# Patient Record
Sex: Female | Born: 1935 | Race: White | Hispanic: No
Health system: Southern US, Community
[De-identification: ages and names within clinical notes are randomized; demographics above are authoritative.]

---

## 1999-03-26 ENCOUNTER — Other Ambulatory Visit: Admission: RE | Admit: 1999-03-26 | Discharge: 1999-03-26 | Payer: Self-pay | Admitting: Family Medicine

## 2003-12-04 ENCOUNTER — Inpatient Hospital Stay (HOSPITAL_COMMUNITY): Admission: EM | Admit: 2003-12-04 | Discharge: 2003-12-17 | Payer: Self-pay | Admitting: Emergency Medicine

## 2004-01-07 ENCOUNTER — Encounter
Admission: RE | Admit: 2004-01-07 | Discharge: 2004-01-07 | Payer: Self-pay | Admitting: Thoracic Surgery (Cardiothoracic Vascular Surgery)

## 2004-05-20 ENCOUNTER — Ambulatory Visit: Payer: Self-pay | Admitting: Physical Medicine & Rehabilitation

## 2004-05-20 ENCOUNTER — Encounter (INDEPENDENT_AMBULATORY_CARE_PROVIDER_SITE_OTHER): Payer: Self-pay | Admitting: *Deleted

## 2004-05-20 ENCOUNTER — Inpatient Hospital Stay (HOSPITAL_COMMUNITY): Admission: EM | Admit: 2004-05-20 | Discharge: 2004-05-24 | Payer: Self-pay | Admitting: Emergency Medicine

## 2004-09-05 ENCOUNTER — Encounter: Admission: RE | Admit: 2004-09-05 | Discharge: 2004-09-05 | Payer: Self-pay | Admitting: Family Medicine

## 2005-09-14 ENCOUNTER — Inpatient Hospital Stay (HOSPITAL_COMMUNITY): Admission: EM | Admit: 2005-09-14 | Discharge: 2005-09-19 | Payer: Self-pay | Admitting: *Deleted

## 2006-08-21 ENCOUNTER — Emergency Department (HOSPITAL_COMMUNITY): Admission: EM | Admit: 2006-08-21 | Discharge: 2006-08-21 | Payer: Self-pay | Admitting: Emergency Medicine

## 2008-07-19 IMAGING — CT CT MAXILLOFACIAL W/O CM
4 of 5 series · 16 of 30 positions shown, 18 images · non-contrast
Comparison: 05/21/2004

CLINICAL DATA: Fall with forehead hematoma.

HEAD CT WITHOUT CONTRAST
TECHNIQUE: 5mm collimated images were obtained from the base of the skull
through the vertex according to standard protocol without contrast.
TECHNIQUE: Axial and coronal plane CT imaging of the maxillofacial structures
was performed including the facial bones, paranasal sinuses, and orbits.  No
intravenous contrast was administered.

[Series 3: recon 2: brain · axial · 0.47mm/px · z∈[+163,+271]mm · 4 of 64 slices shown]
[im 13/64  bone]
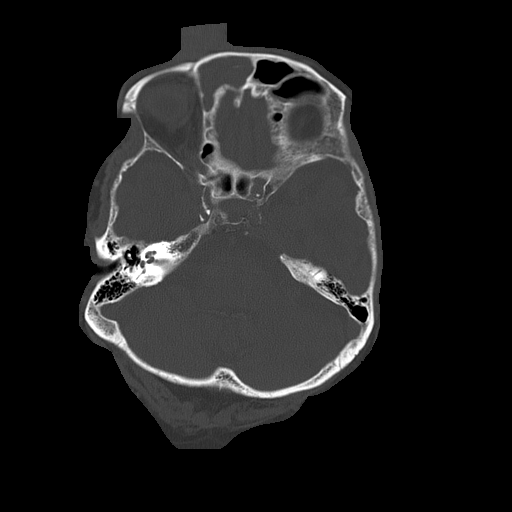
[im 26/64  bone]
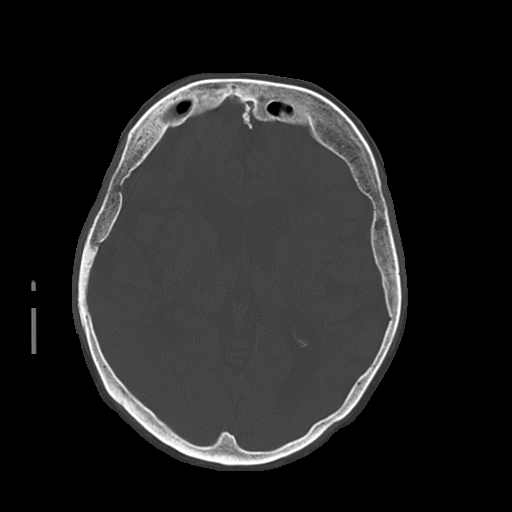
[im 38/64  bone]
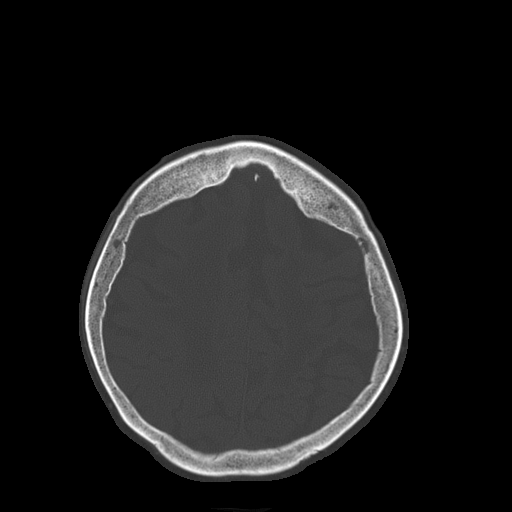
[im 51/64  bone]
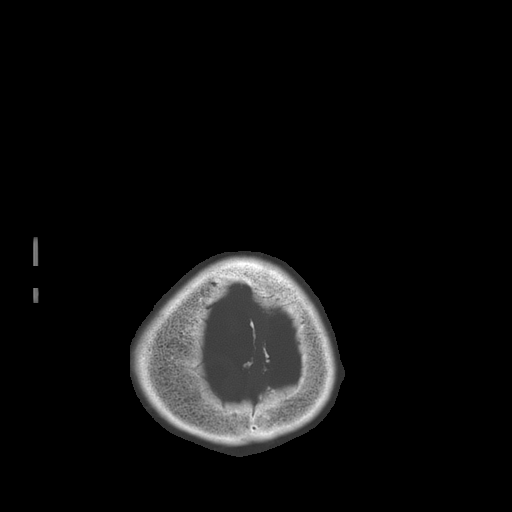

[Series 5: recon 2: supine facial bones · axial · 0.35mm/px · z∈[+37,+152]mm · 5 of 70 slices shown, 7 images]
[im 12/70  brain]
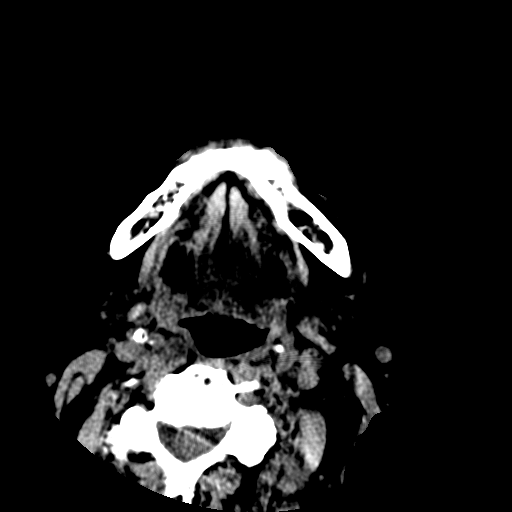
[im 12/70  bone]
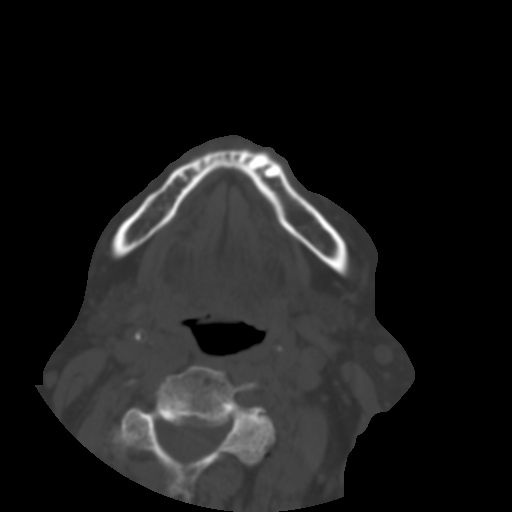
[im 24/70  bone]
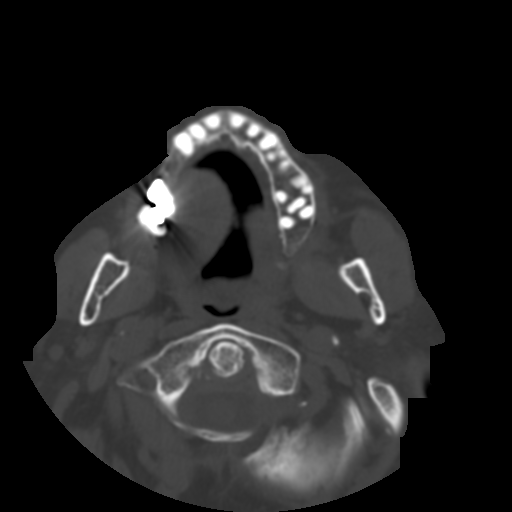
[im 35/70  bone]
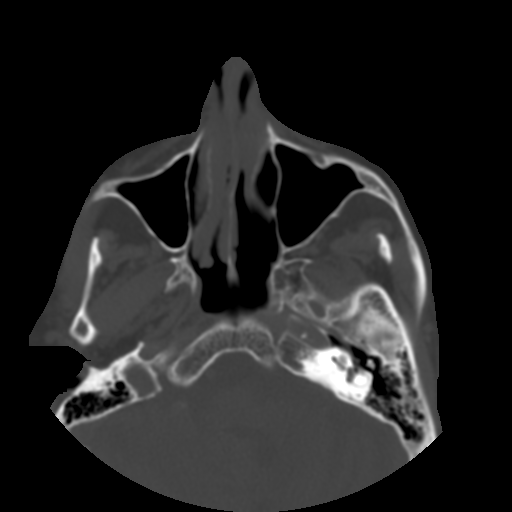
[im 47/70  bone]
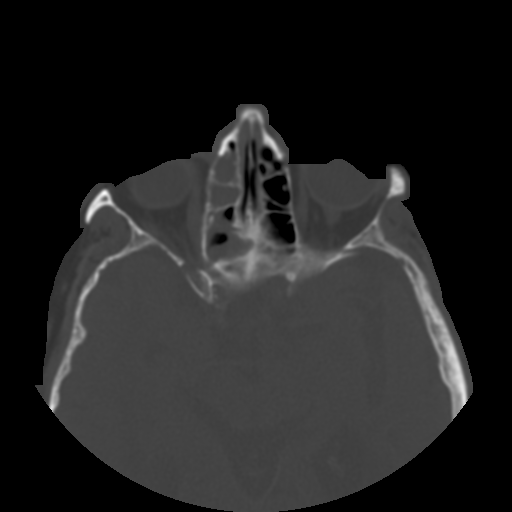
[im 58/70  brain]
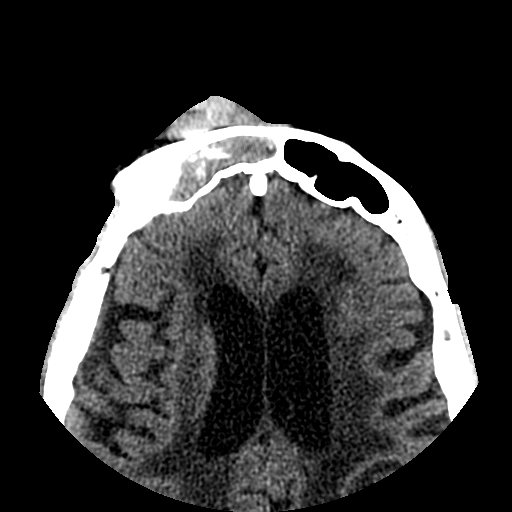
[im 58/70  bone]
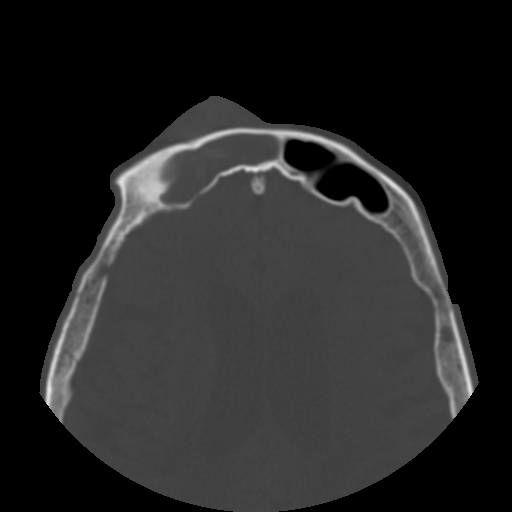

[Series 600: reformatted · sagittal · 0.35mm/px · 5 of 77 slices shown (1 of 2)]
[im 13/77  bone]
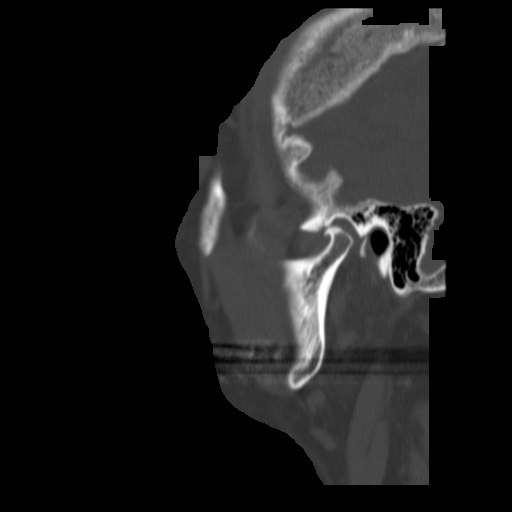
[im 26/77  bone]
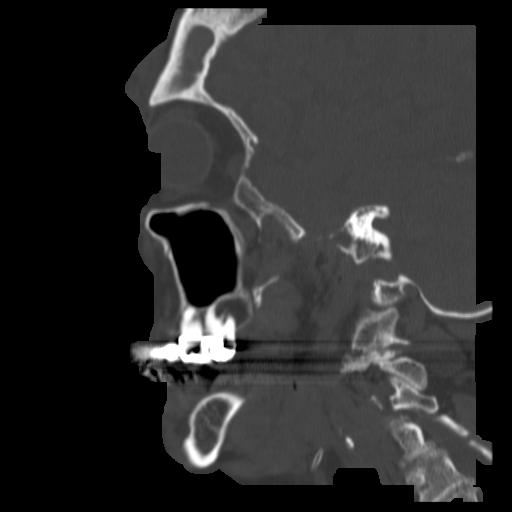
[im 39/77  bone]
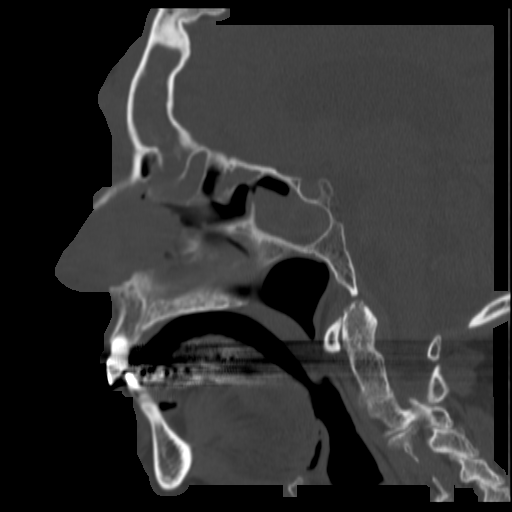
[im 51/77  bone]
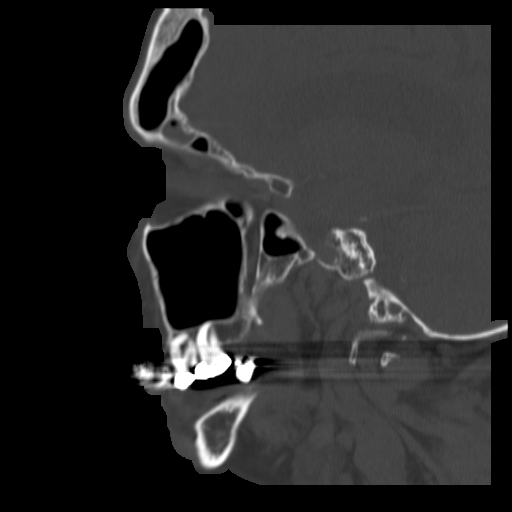
[im 64/77  bone]
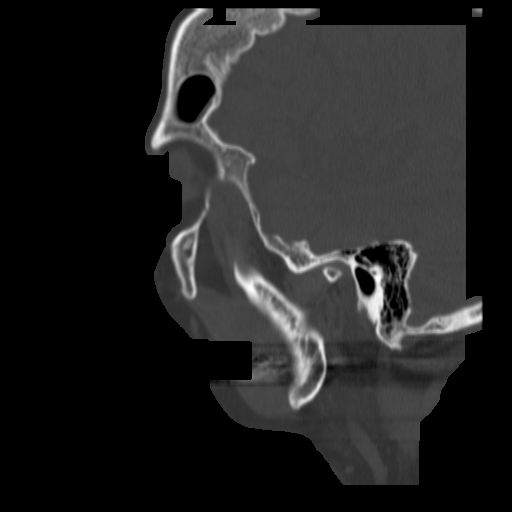

[Series 601: reformatted · coronal · 0.35mm/px · 2 of 72 slices shown (2 of 2)]
[im 12/72  bone]
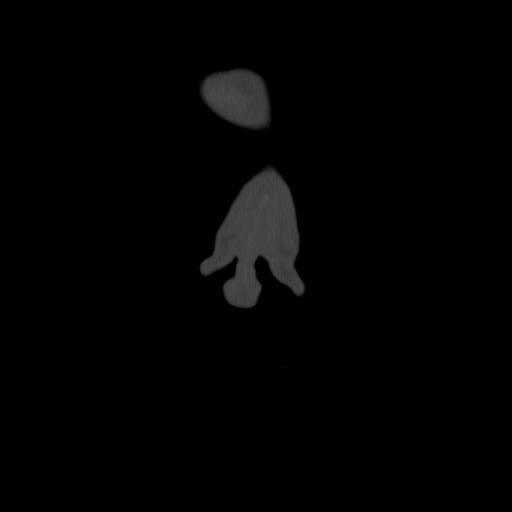
[im 24/72  bone]
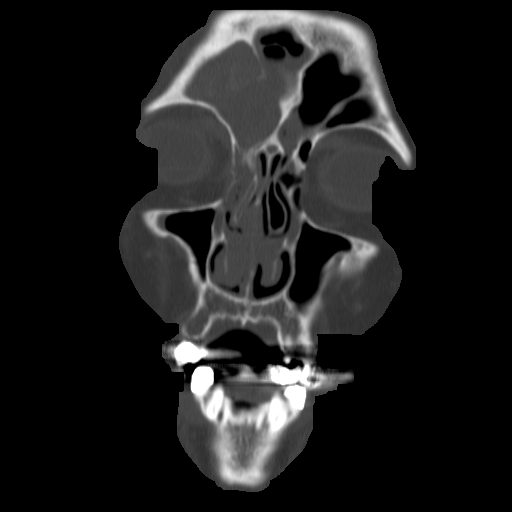

[16 of 30 positions shown; findings below may reference images not displayed]

FINDINGS: Prominent periventricular white matter and corona radiata hypodensity
is present and is consistent with chronic ischemic microvascular white matter
disease. Remote left temporal lobe encephalomalacia noted. No acute CVA, mass
lesion, or intracranial hemorrhage is identified.

There is chronic opacification of the right ethmoid air cells, right sphenoid
sinus, and right frontal sinus and, compatible with chronic sinusitis. Prominent
right forehead scalp hematoma noted. This tracks down along the bridge of the
nose and into the periorbital region on the right.

IMPRESSION

1. Right forehead hematoma tracking down along the bridge of the nose and into
the right periorbital region.
2. Chronic ischemic microvascular white matter disease.
3. Old left temporal encephalomalacia. 

---

MAXILLOFACIAL CT WITHOUT CONTRAST
FINDINGS: Upper cervical spondylosis is present. There is subtotal
opacification the right frontal sinus with internal lacelike calcifications,
stable from the prior exam from 2442 and compatible with chronic sinusitis. In
the right frontal scalp, there is a prominent scalp hematoma tracking down along
the bridge of the nose. There appears to be a nondisplaced fracture of the left
nasal bone. The anterior nasal spine appears intact. The pterygoid plates and
zygomatic arches appear intact.

There is stable chronic opacification of multiple right-sided ethmoid air cells
and stable subtotal opacification of the right sphenoid sinus. No discrete
intraorbital abnormality is identified. No gross injury of either globe is
noted.

IMPRESSION

1. Nondisplaced left nasal fracture.
2. Chronic right-sided sinus opacification.
3. Prominent right forehead scalp hematoma.

## 2009-02-01 ENCOUNTER — Emergency Department (HOSPITAL_COMMUNITY): Admission: EM | Admit: 2009-02-01 | Discharge: 2009-02-02 | Payer: Self-pay | Admitting: Emergency Medicine

## 2010-04-11 ENCOUNTER — Emergency Department (HOSPITAL_COMMUNITY)
Admission: EM | Admit: 2010-04-11 | Discharge: 2010-04-11 | Payer: Self-pay | Source: Home / Self Care | Admitting: Emergency Medicine

## 2010-06-30 LAB — BASIC METABOLIC PANEL
Chloride: 109 mEq/L (ref 96–112)
Creatinine, Ser: 1.18 mg/dL (ref 0.4–1.2)
GFR calc Af Amer: 53 mL/min — ABNORMAL LOW (ref >60)
GFR calc non Af Amer: 44 mL/min — ABNORMAL LOW (ref >60)
Potassium: 4.8 mEq/L (ref 3.5–5.1)

## 2010-06-30 LAB — DIFFERENTIAL
Basophils Absolute: 0.1 10*3/uL (ref 0.0–0.1)
Eosinophils Relative: 9 % — ABNORMAL HIGH (ref 0–5)
Lymphocytes Relative: 16 % (ref 12–46)
Lymphs Abs: 2.1 10*3/uL (ref 0.7–4.0)
Neutro Abs: 8.6 10*3/uL — ABNORMAL HIGH (ref 1.7–7.7)

## 2010-06-30 LAB — URINALYSIS, ROUTINE W REFLEX MICROSCOPIC
Glucose, UA: NEGATIVE mg/dL
Nitrite: NEGATIVE
pH: 6 (ref 5.0–8.0)

## 2010-06-30 LAB — CBC
HCT: 42.6 % (ref 36.0–46.0)
MCV: 94 fL (ref 78.0–100.0)
Platelets: 223 10*3/uL (ref 150–400)
RBC: 4.53 MIL/uL (ref 3.87–5.11)
WBC: 13.2 10*3/uL — ABNORMAL HIGH (ref 4.0–10.5)

## 2010-07-24 LAB — URINE CULTURE

## 2010-07-24 LAB — CBC
HCT: 37.2 % (ref 36.0–46.0)
Hemoglobin: 12.8 g/dL (ref 12.0–15.0)
MCHC: 34.3 g/dL (ref 30.0–36.0)
MCV: 92.1 fL (ref 78.0–100.0)
RBC: 4.04 MIL/uL (ref 3.87–5.11)
WBC: 13.4 10*3/uL — ABNORMAL HIGH (ref 4.0–10.5)

## 2010-07-24 LAB — URINALYSIS, ROUTINE W REFLEX MICROSCOPIC
Glucose, UA: NEGATIVE mg/dL
Ketones, ur: NEGATIVE mg/dL
Protein, ur: NEGATIVE mg/dL
Urobilinogen, UA: 0.2 mg/dL (ref 0.0–1.0)

## 2010-07-24 LAB — BASIC METABOLIC PANEL
CO2: 21 mEq/L (ref 19–32)
Chloride: 108 mEq/L (ref 96–112)
Creatinine, Ser: 0.96 mg/dL (ref 0.4–1.2)
GFR calc Af Amer: >60 mL/min (ref >60)
Potassium: 4.5 mEq/L (ref 3.5–5.1)
Sodium: 137 mEq/L (ref 135–145)

## 2010-07-24 LAB — DIFFERENTIAL
Basophils Relative: 1 % (ref 0–1)
Eosinophils Absolute: 1.1 10*3/uL — ABNORMAL HIGH (ref 0.0–0.7)
Eosinophils Relative: 8 % — ABNORMAL HIGH (ref 0–5)
Lymphs Abs: 1.7 10*3/uL (ref 0.7–4.0)
Monocytes Absolute: 0.7 10*3/uL (ref 0.1–1.0)
Monocytes Relative: 5 % (ref 3–12)

## 2010-07-24 LAB — POCT CARDIAC MARKERS
CKMB, poc: 1.2 ng/mL (ref 1.0–8.0)
Myoglobin, poc: 80.3 ng/mL (ref 12–200)
Myoglobin, poc: 93.9 ng/mL (ref 12–200)

## 2010-07-24 LAB — URINE MICROSCOPIC-ADD ON

## 2010-09-05 NOTE — Discharge Summary (Signed)
[name], Holly Rios                  ACCOUNT NO.:  [id number]   MEDICAL RECORD NO.:  [id number]          PATIENT TYPE:  INP   LOCATION:  1412                         FACILITY:  [facility]   PHYSICIAN:  Sherin Quarry, MD      DATE OF BIRTH:  02-Jan-1926   DATE OF ADMISSION:  09/14/2005  DATE OF DISCHARGE:  09/19/2005                                 DISCHARGE SUMMARY   Holly Rios is a 77 year old lady with a past history of coronary artery  disease, status post CABG, glaucoma, and dementia, who initially presented  to [facility] on Sep 14, 2005 with increased confusion and  weakness.  In the emergency room, she was noted to have evidence of pyuria,  elevated white count, and dehydration.  In addition, she was noted to have  an elevated blood sugar, with no previous history of diabetes.  When she was  initially seen by Dr. Rito Ehrlich in the emergency room, he really was not able  to get any history because of her mental confusion and lethargy.   PHYSICAL EXAMINATION:  VITAL SIGNS:  Temperature 98.7, heart rate 82, blood  pressure 103/31, respirations 24, O2 saturation 98%.  HEENT:  Within normal limits.  CHEST:  Clear.  CARDIOVASCULAR:  Showed normal S1 and S2, without rubs, murmurs, or gallops.  ABDOMEN:  Benign.  NEUROLOGIC:  The patient was disoriented.  She moved all extremities.  There  were no focal findings.  EXTREMITIES:  Showed no evidence of cyanosis.  There was trace pitting  edema.   RELEVANT LABORATORY STUDIES OBTAINED:  Included a urinalysis which showed  large blood and was positive for nitrite and leukocytes.  The urine  microscopic examination showed too numerous to count white cells, with  bacteria present.  CBC:  White count 13,300, hemoglobin 12.5.  On the CMET,  the blood sugar was 220, potassium 4, creatinine 1.2, BUN 28.  Liver  functions were normal.  Subsequently, blood cultures were obtained which  grew out an Escherichia coli species which was resistant to  ampicillin and  Augmentin.  It was sensitive to all other antibiotics tested as well as  trimethoprim sulfa.  Urine culture is also growing Escherichia coli, but the  sensitivities have not been done as of this time.   On admission, Holly Rios was placed on a sliding scale insulin regimen.  She  was given an IV of normal saline at 85 cc/hour.  Cipro 400 mg IV q.12 h. was  initiated.  Her blood sugar initially was in the range of 180-250, and  therefore 10 units of Lantus insulin daily was added to her regimen.  On Sep 15, 2005, Cipro was discontinued because the patient was having persistent  nausea and vomiting, and there was concern that the Cipro might have induced  this.  Therefore, she was switched to Zosyn 3.375 g IV q.6 h.  After  discussion with the patient and her family, she was made a Do Not  Resuscitate status.  As a screening study, a renal ultrasound was done which  showed normal-size kidneys,  with very mild dilatation of the right renal  pelvis and no evidence of obstruction or abscess.  A chest x-ray showed very  mild vascular congestion.  On Sep 16, 2005, the patient was noted to have a  slight degree of wheezing on auscultation of the chest.  I suspected that  she was somewhat volume overloaded and at that time discontinued her  intravenous fluids and gave her 2 mg of IV Lasix.  After this was done, the  wheezing resolved.  On Sep 17, 2005, the patient's Foley catheter was  discontinued.  Lantus insulin was also discontinued, because the patient's  blood sugars had improved and were in the range of 100-130.  Amaryl had been  started at 2 mg daily.  By September 19, 2005, the patient appeared to be a  candidate for discharge.   DISCHARGE DIAGNOSES:  1.  Urinary tract infection, with gram negative sepsis.  2.  Increased mental confusion secondary to #1.  3.  Dementia.  4.  Diabetes, apparently new onset.  5.  Glaucoma.  6.  Coronary artery disease, status post coronary  artery bypass graft.  7.  Depression.  8.  Mild transient renal dysfunction.   DISCHARGE MEDICATIONS:  The patient will continue all of her usual  medications which consisted of aspirin 81 mg daily, multivitamin 1 daily, Os-  Cal D 1 daily, metoprolol 50 mg b.i.d., Paxil 20 mg daily, Cosopt 0.05% 1  drop to each eye b.i.d., Plavix 75 mg daily, Requip 1 mg daily, Lipitor 10  mg daily, Xanax 0.25 mg at bedtime daily, Lumigan 1 drop each eye at  bedtime, Darvocet p.r.n. for headache.  In addition, she will be advised to  take Amaryl 2 mg daily, and Ceftin 500 mg b.i.d. for 8 additional days.  As  mentioned previously, the reason the patient was stopped from her quinolone  is because of the concern that this medication might have caused her nausea  and vomiting.   She was advised to follow up with Dr. Elsworth Soho in about 7 days to  recheck urine status and blood sugar.  During the course of Mrs. Mathews's  hospitalization, significant improvement in her mental status was noted.  She will continue to be cared for at home by her family, who seemed to be  doing an excellent job of meeting her day-to-day needs.   CONDITION AT THE TIME OF DISCHARGE:  Good.           ______________________________  Sherin Quarry, MD     SY/MEDQ  D:  09/18/2005  T:  09/18/2005  Job:  [id number]   cc:   L. Lupe Carney, M.D.  Fax: [phone]

## 2010-09-05 NOTE — Discharge Summary (Signed)
Holly Rios, Holly Rios                  ACCOUNT NO.:  [id number]   MEDICAL RECORD NO.:  [id number]          PATIENT TYPE:  INP   LOCATION:  4702                         FACILITY:  [facility]   PHYSICIAN:  Theone Stanley, MD   DATE OF BIRTH:  1926-01-22   DATE OF ADMISSION:  05/19/2004  DATE OF DISCHARGE:  05/24/2004                                 DISCHARGE SUMMARY   ADMITTING DIAGNOSES:  1.  Confusion.  2.  Multiple sclerosis.  3.  Depression.  4.  Coronary artery bypass graft in 2005.   DISCHARGE DIAGNOSES:  1.  Acute cerebrovascular accident of the left temporal and temporal      parietal regions.  2.  Confusion.  3.  Multiple sclerosis.  4.  Depression.  5.  Coronary artery bypass graft in 2005.   CONSULTATIONS:  Neurology Dr. Anne Hahn, speech therapy, physical therapy, and  occupational therapy.   PROCEDURES/DIAGNOSTIC TESTS:  The patient had a CT scan of the brain on  January 30; overall impression is chronic atrophy and advanced small vessel  ischemic changes in hemispheric white matter, questionable acute-to-subacute  starting in left temporal through temporal parietal region.  MRI of the  brain was performed on February 1.  Impression:  Hypertensive signal on the  left temporal and left temporal parietal region most consistent with acute  ischemic infarct.  Extensive white matter changes with white matter tissue  bihemispherically, hyperintensive signal with corpus callosum patient's  history of MS, diffuse atrophy for age, pansinusitis, changes prominent in  the right frontal and right sphenoid ethmoid region. MI was also performed  at that time which showed a high suspicion of hyperdynamic significant  stenosis of left vertebral basilar junction with some absent fluorescein on  the right vertebral artery may reflect severe flow secondary to cirrhosis or  inclusion.  Probably high grade stenosis focal in nature of the left middle  cerebral artery proximally, scattered  atherosclerotic changes involving the  middle cerebral artery trifurcation regions and also in the left posterior  cerebral artery.  MRI of the neck showed focal high-grade stenosis of the  left vertebral basilar junction with probably 50% stenosis at the origin of  the left dominant vertebral artery, probable high-grade stenosis involving  the right vertebral artery proximally, approximately 60% stenosis of the  right internal carotid artery at the bulb by the criteria.  The patient also  had a swallow evaluation by speech therapy and there recommendations were a  regular diet with thin liquids.  A CT of the chest was performed, along on  February 1 which showed an 8 mm nodular density in the right lower lobe,  superior segment, and no other masses were seen.  Follow up CT suggested at  3 months interval for 1 year to assure stability.   The patient had an echocardiogram on May 20, 2004. Overall impression  was left ventricular systolic function was normal.  Left ventricular  ejection fraction was estimated to be 55-65%.  Study was inadequate for  evaluation of left ventricular wall motion abnormality.  The left  ventricular  wall thickness was at the upper limits of normal.  I felt that  this was mild holosystolic murmur, valve prolapse along the anterior leaflet  with mild mitral annular calcification. There was mild valvular  regurgitation.   A carotid Doppler was also performed with impression of right, 46% of IA/CA  stenosis, lower end of scale, left no evidence of significant ICA stenosis,  bilateral vertebral artery flow was antegrade.   HOSPITAL COURSE:  Ms. Jabs is a 77 year old Caucasian female with multiple  sclerosis which has been stable at this point in time.  When the family  noticed, last p.m. prior to admission she seemed a little bit confused.  She  was found in the laundry room trying to turn on the television.  She was  easily redirected and showed no other  neurological deficit at that time.  At  10 a.m. prior to admission her daughter had called and she seemed to be  confused and she had altered speech.  At that point in time the patient was  brought to the ER.  After evaluation it was noted that the patient had an  acute CVA.   Issue #1, acute CVA, at that point in time the hospitalists were contacted  for admission and neurology was consulted.  Evaluation of the patient.  The  patient had a workup for her acute stroke including MRA, MRI, carotid  Dopplers and echocardiogram.  Please see results above.  It was decided to  place the patient on Plavix because she was on aspirin.  In addition, she  was placed on Zocor.  Rehab was consulted and there was a question of  whether she would go to rehab. However, the family did not want her to go.  They wanted her to go home with home PT and OT.  This was agreed upon and  she was discharged to home with home PT and OT in stable condition.   In regards to her other medical issues.  The patient has a history of MS,  this has been stable.  She will follow up with her neurologist in regards to  that.  She was continued on all of her medications while here in the  hospital.   The only other issue is this small nodule on the CT scan of the chest.  This  will need to be followed up when she has repeat CT scans suggested by the  radiologist; and we will let her primary care physician continue with these  follow up CTs.   She did have a urinary tract infection; it was discovered in the hospital.  She was started on Cipro and her dysuria and malodorous urine resolved on  the Cipro.   The patient left the hospital in stable on 05/24/2004.   DISCHARGE MEDICATIONS:  1.  Plavix 75 mg 1 p.o. daily.  2.  Zocor 20 mg 1 p.o. q.h.s.  3.  Lexapro 10 mg 1 p.o. daily.  4.  Lopressor 25 mg 1 p.o. b.i.d.  5.  Multivitamin 1 p.o. daily.  6.  Several eye drops including Lumigan, Trusopt and Timoptic eye drops at      home dosage.  7.  Pred forte eye drops at home dosage.  8.  Colace 100 mg 1 p.o. b.i.d.  9.  Ciprofloxacin 500 mg 1 p.o. b.i.d. for an additional 4 days.  10. Aspirin 81 mg 1 p.o. daily.  11. Xanax 0.25 mg 1 p.o. q.h.s.  12. ??  1 p.o. q.h.s.  13. Iron sulfate 325 1 p.o. b.i.d.  14. Darvocet-N 100 at home dosage.   DISCHARGE INSTRUCTIONS:  The patient was instructed to continue with a  cardiac diet.  She had home health care, PT, OT, and speech therapy ordered  for home.  The patient was to follow up with Dr. Pearlean Brownie the neurologist in 2  months and Dr. Clovis Riley in 2-3 weeks.      AEJ/MEDQ  D:  08/05/2004  T:  08/05/2004  Job:  [id number]   cc:   Pramod P. Pearlean Brownie, MD  Fax: [phone]

## 2010-09-05 NOTE — H&P (Signed)
Holly Rios, Holly Rios                              ACCOUNT NO.:  [id number]   MEDICAL RECORD NO.:  [id number]                   PATIENT TYPE:  EMS   LOCATION:  MAJO                                 FACILITY:  [facility]   PHYSICIAN:  Kela Millin, M.D.             DATE OF BIRTH:  1925-09-26   DATE OF ADMISSION:  12/04/2003  DATE OF DISCHARGE:                                HISTORY & PHYSICAL   PRIMARY CARE PHYSICIAN:  Dr. Lupe Carney.   CHIEF COMPLAINT:  Chest pain.   HISTORY OF PRESENT ILLNESS:  The patient is a 77 year old white female with  past medical history significant for multiple sclerosis and hiatal hernia  who presents with complaints of chest pain.  The patient says that she first  started having chest pain about 2 months ago but in the past 6 weeks the  chest pain has become more frequent and today she had 2 episodes.  The chest  pain is described as mid-sternal in location and radiating down her right  upper extremity, 8/10 in intensity, associated with nausea but no vomiting.  The patient denies shortness of breath, diaphoresis.  The chest pain is  worsened by exertion.  Holly Rios was seen by her primary care physician today  and her EKG noted to be abnormal and the patient was sent to the ER for  further evaluation and management.  The patient denies cough, fevers,  dysuria, abdominal pain, melena, hematochezia, hematemesis, and melena.   PAST MEDICAL HISTORY:  As stated above.   CURRENT MEDICATIONS:  1. Darvocet 1 p.o. t.i.d. p.r.n.  2. Xanax 0.25 mg p.o. b.i.d. p.r.n.  3. Celebrex 200 mg daily -- the patient stopped taking Celebrex about 2     months ago.  4. Calcium q.h.s.   SOCIAL HISTORY:  Quit tobacco about 30 years ago, denies alcohol, also  denies illicit drug use.   FAMILY HISTORY:  Mom and dad had hypertension and heart disease.   REVIEW OF SYSTEMS:  As per HPI.  Other comprehensive review of systems  negative.   PHYSICAL EXAMINATION:  GENERAL:  The  patient is an elderly white female,  well-nourished, well-developed, in no acute distress.  VITAL SIGNS:  Blood pressure 179/89, initially 147/69.  Her pulse was 99 and  temperature 99.4.  O2 saturation 99%.  HEENT:  PERRL, EOMi, moist mucous membranes, sclerae are anicteric, no oral  exudate.  NECK:  Supple, no adenopathy, no thyromegaly, no JVD and no carotid bruits.  LUNGS:  Clear to auscultation bilaterally.  No crackles or wheezes.  CARDIOVASCULAR:  Normal S1, S2, regular rate and rhythm, no S3.  ABDOMEN:  Soft, nontender, nondistended, no organomegaly.  Bowel sounds  present, no masses palpable.  EXTREMITIES:  No clubbing, cyanosis or edema.  NEURO:  Alert and oriented x3.  Cranial nerves II-XII are grossly intact.  Nonfocal exam.   ADMISSION LABORATORIES:  Chest  x-ray:  No acute disease.  Sodium is 142, potassium 4.7, chloride is 112, BUN 24, glucose 82, pH is  7.39, PCO2 of 40.9.  Hematocrit 38, hemoglobin is 12.9.  Her CK-MB is 1 and  the Troponin is less than 0.05.  The myoglobin is 62.6.  EKG:  There is ST depression noted in 2, 3 and AVF and also in V4 and V5.   ASSESSMENT AND PLAN:  1. Chest pain, ?unstable angina.  The patient with exertional chest pain and     ST changes on EKG, also increased frequency of chest pain over the past 6     weeks.  We will do serial cardiac enzymes and EKG.  We will start     aspirin, nitroglycerine and Lovenox 1 mg, subcutaneous q.12 hours.  We     will also consult cardiology for further evaluation/risk stratification.     The patient discussed with [facility].  2. History of multiple sclerosis - stable.  3. History of hiatal hernia - Protonix, follow.                                                Kela Millin, M.D.    ACV/MEDQ  D:  12/04/2003  T:  12/04/2003  Job:  [id number]   cc:   L. Lupe Carney, M.D.  [address] [place]  [place] [address]  Fax: [phone]

## 2010-09-05 NOTE — Op Note (Signed)
NAMEMICHA, Holly Rios                              ACCOUNT NO.:  [id number]   MEDICAL RECORD NO.:  [id number]                   PATIENT TYPE:  INP   LOCATION:  2310                                 FACILITY:  [facility]   PHYSICIAN:  Salvatore Decent. Dorris Fetch, M.D.         DATE OF BIRTH:  May 13, 1925   DATE OF PROCEDURE:  12/10/2003  DATE OF DISCHARGE:                                 OPERATIVE REPORT   PREOPERATIVE DIAGNOSIS:  Severe three-vessel coronary disease with unstable  angina.   POSTOPERATIVE DIAGNOSIS:  Severe three-vessel coronary disease with unstable  angina.   PROCEDURES:  1. Median sternotomy.  2. Extracorporeal circulation.  3. Coronary artery bypass grafting x3 (left internal mammary artery to the     left anterior descending coronary artery, saphenous vein graft to first     obtuse marginal, saphenous vein graft to distal right coronary).  4. Endoscopic vein harvest from both thighs.   SURGEON:  Salvatore Decent. Dorris Fetch, M.D.   ASSISTANT:  Toribio Harbour, N.P.   ANESTHESIA:  General.   FINDINGS:  Endoscopic vein harvest from right thigh vein was unusable due to  small caliber.  Left thigh vein good quality.  Good-quality targets.  Mammary artery with good quality.  Normal left ventricular size.   CLINICAL NOTE:  Holly Rios is a 77 year old woman with multiple sclerosis, who  presented with unstable angina.  At cardiac catheterization she was found to  have severe three-vessel coronary disease and preserved left ventricular  function.  The patient was referred for coronary artery bypass grafting.  Consideration was given to high-risk angioplasty, but it was felt that a  good result with LAD angioplasty was unlikely; therefore, the patient was  advised to undergo coronary artery bypass grafting for survival benefit and  relief of symptoms.  The indication, risks, benefits, and alternatives were  discussed in detail with the patient and her family.  She understood and  accepted the risks and agreed to proceed.   OPERATIVE NOTE:  Holly Rios was brought to the preop holding area on December 10, 2003.  There lines were placed by the anesthesia service to monitor  arterial, central venous, and pulmonary arterial pressure.  Intravenous  antibiotics were administered.  The patient was taken to the operating room  and anesthetized and intubated.  A Foley catheter was placed.  The chest,  abdomen, and legs were prepped and draped in the usual fashion.   An incision was made in the medial aspect of the right leg and greater  saphenous vein was harvested from the right thigh.  At the level of the knee  this appeared to be of adequate caliber; however, after removing the vein it  was found to be of very small caliber as it was a bifurcated system.  This  vessel was not suitable for use with bypass grafting.  Therefore, an  additional incision was made in the medial  aspect of the left leg at the  level of the knee and greater saphenous vein was harvested from the left  leg.  This vessel was of good quality.  A median sternotomy was performed.  The left internal mammary artery was harvested using standard technique.  The patient was fully heparinized prior to dividing the distal end of the  mammary artery.  There was good flow through the cut end of the vessel.  The  mammary was of adequate size and caliber.   The pericardium was opened and the ascending aorta was inspected and  palpated.  There was palpable atherosclerotic plaque just proximal to the  takeoff of the innominate artery.  The cannulation therefore was moved  approximately 2 cm more proximal to a site that did not have atherosclerotic  plaque.  The aorta was cannulated via concentric 2-0 Ethibond pledgeted  pursestring sutures.  A dual-stage venous cannula was placed via pursestring  suture in the right atrial appendage.  After confirming adequate  anticoagulation with ACT measurement, cardiopulmonary  bypass was instituted  and the patient was cooled to 32 degrees Celsius.  The coronary arteries  were inspected and anastomotic sites were chosen.  The conduits were  inspected and cut to length.  A foam pad was placed in the pericardium to  protect the left phrenic nerve.  A temperature probe was placed in the  myocardial septum and a cardioplegia cannula was placed in the ascending  aorta.   The aorta was crossclamped.  The left ventricle was emptied via the aortic  root vent.  Cardiac arrest then was achieved with a combination of cold  antegrade blood cardioplegia and topical iced saline.  After achieving a  complete diastolic arrest and a myocardial septal temperature of 10 degrees  Celsius, the following distal anastomoses were performed.   First a reversed saphenous vein graft was placed end-to-side to the right  coronary just prior to the takeoff of the posterior descending.  This was a  1.5 mm good-quality target.  The vein graft was of good quality and the  anastomosis was performed with a running 7-0 Prolene suture.  At completion  of the anastomosis a probe passed proximally and distally.  There was good  flow through the graft, and cardioplegia was administered.  There was good  hemostasis.   Next a reversed saphenous vein graft was placed end-to-side to the first  obtuse marginal branch of the left circumflex coronary artery.  This was a  large vessel that was a dominant lateral wall vessel.  It was a 1.5 mm  vessel at the site of the anastomosis.  It then trifurcated and there was  some disease at the trifurcation.  A 1.5 mm probe passed into the largest  branch.  Only a 1 mm probe would pass into a smaller branch.  The vessel was  not graftable because of small size beyond this trifurcation.  A reversed  saphenous vein graft was placed end-to-side to the obtuse marginal 1 with a running 7-0 Prolene suture.  Again the vein was of good quality.  The  anastomosis was  probed proximally and distally and a probe passed easily.  Cardioplegia then was administered.  There was good hemostasis.   Next the left internal mammary artery was brought through a window in the  pericardium.  The distal end was spatulated, then was anastomosed end-to-  side to the mid-LAD.  The LAD was a 1.5 mm good-quality target.  The mammary  was a 1.5 mm good-quality conduit.  The anastomosis was performed with a  running 8-0 Prolene suture.  At the completion of the mammary to LAD  anastomosis, the bulldog clamp was briefly removed and inspected for  hemostasis.  Immediate and rapid septal rewarming was noted.  The bulldog  clamp was briefly removed to inspect for hemostasis.  Immediate and rapid  septal rewarming was noted.  The bulldog clamp was replaced.  The mammary  pedicle was tacked to the epicardial surface of the heart with 6-0 Prolene  sutures.   Additional cardioplegia then was administered.  The vein grafts were cut to  length.  The cardioplegia cannula was removed from the ascending aorta and  the proximal vein graft anastomoses were performed to 4.0 mm punch  aortotomies while under crossclamp.  After completion of the final proximal  anastomosis, the patient was placed in Trendelenburg position.  Lidocaine  was administered.  The bulldog clamp was again removed from the mammary  artery.  The aortic root was de-aired and the aortic crossclamp was removed.  The total crossclamp time was 54 minutes.  The vein grafts were aspirated to  remove any entrapped air prior to releasing the bulldog clamps.  The patient  initially fibrillated but then spontaneously resumed sinus rhythm.  Total  crossclamp time was 54 minutes.   While the patient was being rewarmed, all proximal and distal anastomoses  were inspected for hemostasis.  Epicardial pacing wires were placed on the  right ventricle and right atrium.  When the patient had been rewarmed to a  core temperature of 37  degrees Celsius, she was weaned from cardiopulmonary  bypass without difficulty.  The total bypass time was 84 minutes.  The  initial cardiac index was low, but the patient responded to volume  resuscitation and initiation of a low-dose dopamine drip.  A test dose of  protamine was administered and was well-tolerated.  The atrial and aortic  cannulae were removed.  The remainder of the protamine was administered  without incident.  The chest was irrigated with 1 L of warm normal saline  containing 1 g of vancomycin.  Hemostasis was achieved.  A left pleural and  two mediastinal chest tubes were placed through separate subcostal  incisions.  The pericardium was reapproximated loosely with interrupted 3-0  silk sutures.  It came together without tension or kinking of the underlying  grafts.  The sternum was closed with interrupted heavy-gauge stainless steel wires.  The remainder of the incisions were closed in standard fashion,  subcuticular skin closures were used.  All sponge, needle, and instrument  counts were correct at the end of the procedure.  The patient taken from the  operating room to the surgical intensive care unit in critical but stable  condition.                                               Salvatore Decent Dorris Fetch, M.D.    SCH/MEDQ  D:  12/10/2003  T:  12/11/2003  Job:  [id number]   cc:   Aram Candela. Aleen Campi, M.D.  [address]  [place]  [place] [address]  Fax: [phone]

## 2010-09-05 NOTE — Op Note (Signed)
Holly Rios, Holly Rios                              ACCOUNT NO.:  [id number]   MEDICAL RECORD NO.:  [id number]                   PATIENT TYPE:  INP   LOCATION:  2310                                 FACILITY:  [facility]   PHYSICIAN:  Salvatore Decent. Dorris Fetch, M.D.         DATE OF BIRTH:  12-02-1925   DATE OF PROCEDURE:  12/11/2003  DATE OF DISCHARGE:                                 OPERATIVE REPORT   PREOPERATIVE DIAGNOSES:  Massive postoperative hemorrhage.   POSTOPERATIVE DIAGNOSES:  Massive postoperative hemorrhage.   OPERATION PERFORMED:  Re-exploration of the mediastinum for bleeding.   SURGEON:  Salvatore Decent. Dorris Fetch, M.D.   ASSISTANT:  Rowe Clack, P.A.-C.   ANESTHESIA:  General.   FINDINGS:  Arterial bleeding from a blowout of the vein graft to the right  coronary.  Unclear if this was a side branch or a weakened area of the vein.   INDICATIONS FOR PROCEDURE:  [name] is a 77 year old lady who on December 10, 2003 had undergone coronary artery bypass grafting times three.  After an  uncomplicated early postoperative course, approximately 4 o'clock a.m. on  December 11, 2003 the patient had sudden onset of massive bleeding from the  mediastinal chest tubes, approximately 700 mL came out in the first 15  minutes and then another 700 mL over the following 30 minutes.  The patient  had transient hypotension which responded appropriately to fluid  resuscitation. The patient was prepared and taken back to the operating room  as rapidly as possible.  The situation was discussed with the patient's  family and they consented to re-exploration.   DESCRIPTION OF PROCEDURE:  [name] was brought directly from the intensive  care unit to the operating room.  She was already intubated.  General  anesthesia was induced without significant hemodynamic effect.  The  patient's chest, abdomen and legs were prepped and draped in the usual  fashion.   The median sternotomy incision was reopened.   The wires were removed.  A  retractor was placed.  The chest was opened.  The anterior mediastinal tube  was elevated and there was marked arterial bleeding from an underlying vein  graft to the right coronary artery.  Soft bulldog clamps were placed  proximally and distally and the bleeding was repaired with two 7-0 Prolene  figure-of-eight sutures.  The defect in the vein graft was approximately 1  mm in diameter.  It was unclear if this was a side branch which had avulsed  or a  weakened area in the vein that had ruptured.   The remainder of the mediastinum then was carefully inspected.  Both vein  grafts stripped easily.  There was no other bleeding noted.  Both pleural  spaces were evacuated.  There was approximately 100 mL of old blood in each  pleural space.  The mediastinal tubes were suctioned.  The endotracheal  catheter removed any remaining  clot.  The chest was irrigated with 1 L of  warm normal saline containing 1 g of vancomycin.  Final inspection for  hemostasis was performed, no other bleeding sites were noted.  The chest  then was reclosed using heavy gauge interrupted  stainless steel wires.  The remainder of the incision was closed in standard  fashion.  Staples were used for the skin.  Because of the emergency nature  of the procedure, instrument counts were not performed prior to opening the  chest.  Therefore, an x-ray will be performed in the operating room.                                               Salvatore Decent Dorris Fetch, M.D.    SCH/MEDQ  D:  12/11/2003  T:  12/11/2003  Job:  [id number]

## 2010-09-05 NOTE — H&P (Signed)
Holly Rios, Holly Rios                  ACCOUNT NO.:  [id number]   MEDICAL RECORD NO.:  [id number]          PATIENT TYPE:  EMS   LOCATION:  ED                           FACILITY:  [facility]   PHYSICIAN:  Hollice Espy, M.D.DATE OF BIRTH:  05-25-1925   DATE OF ADMISSION:  09/14/2005  DATE OF DISCHARGE:                                HISTORY & PHYSICAL   PRIMARY CARE PHYSICIAN:  L. Lupe Carney, M.D.   CHIEF COMPLAINT:  Increased confusion.   HISTORY OF PRESENT ILLNESS:  The patient is a 77 year old white female with  a past medical history of CAD, status post CABG, and glaucoma, as well as  dementia, who presents to the emergency room with increased confusion and  weakness.  In the emergency room, she was found to have a large UTI,  elevated white count, and mild dehydration.  In addition, she was found to  have a sugar of 220, and no reported history of diabetes.  The patient lives  alone, but have family close by who are with her 24/7.  Currently, the  patient is not able to give me much of a history.  She has a hard time  focusing on any one question, but her family is able to interpret any  questions.  However, I am unable to get a review of systems, other than the  patient tells me that she is not in any pain.   PAST MEDICAL HISTORY:  1.  Dementia.  2.  CAD status post CABG.  3.  Hypertension.  4.  Glaucoma.   MEDICATIONS:  1.  Aspirin 81 mg one-half tablet p.o. daily.  2.  Multivitamin daily.  3.  Os-Cal D p.o. daily.  4.  Metoprolol 50 mg p.o. b.i.d.  5.  Paxil 20 p.o. daily.  6.  Cosopt 0.05% one drop each eye b.i.d.  7.  Plavix 75 p.o. daily.  8.  Requip 1 mg p.o. q.h.s.  9.  Lipitor 10 mg p.o. q.h.s.  10. Xanax 0.25 p.o. q.h.s.  11. Lumigan one drop each eye q.h.s.  12. Darvocet-N 100 1-1/2 tablets p.o. b.i.d.  (The patient is also on p.r.n. Darvocet and Xanax.)   SOCIAL HISTORY:  She does not have any history of tobacco, alcohol or drug  use.   ALLERGIES:  1.  DEMEROL.  2.  PHENERGAN.   FAMILY HISTORY:  Noncontributory.   PHYSICAL EXAMINATION:  VITAL SIGNS:  The patient's vitals on admission  revealed a temperature of 98.7, heart rate 82, blood pressure 103/31,  respirations 24, O2 saturation 98% on room air.  GENERAL:  The patient is alert and oriented x2.  HEENT:  Normocephalic and atraumatic.  Mucous membranes are slightly dry.  She has no carotid bruits.  HEART:  Regular rate and rhythm.  Soft S1 and S2.  LUNGS:  Clear to auscultation bilaterally.  ABDOMEN:  Soft, nontender, nondistended.  Positive bowel sounds.  EXTREMITIES:  No clubbing or cyanosis.  Trace pitting edema.   LABORATORY WORK:  Chest x-ray shows no acute abnormalities.  Urinalysis  shows nitrite positive, moderate leukocyte esterase.  Blood cultures drawn  and pending.  Micro UA shows too numerous to count white cells, 7-10 red  cells, a few bacteria and mucous.  Sodium 139, potassium 4, chloride 111,  bicarbonate 21, BUN 28, creatinine 1.2, glucose 220.  LFT's are notable for  an albumin of 3.2 and alkaline phosphatase of 139.  Urine culture is  pending.   ASSESSMENT AND PLAN:  1.  Urinary tract infection, possible sepsis.  The patient is borderline      hypotensive.  Plan to start the patient on IV fluids, IV Cipro renally      dosed, and continue to follow.  2.  Dementia.  The patient is not on any medications for dementia, but      hopefully with fluid hydration and IV antibiotics, her mental state      should go back to her baseline.  3.  Glaucoma.  Continue medications.  4.  Diabetes, hyperlipidemia.  No previous history of.  Will check      hemoglobin A1C and put the patient on a sliding scale.  Likely will need      to start oral medications.  5.  Coronary artery disease with history of coronary artery bypass graft.      Continue the patient's medications.  6.  Depression.  Continue Paxil.  7.  Renal insufficiency, secondary urinary tract infection.  IV  fluids.      Hollice Espy, M.D.  Electronically Signed     SKK/MEDQ  D:  09/14/2005  T:  09/14/2005  Job:  [id number]   cc:   L. Lupe Carney, M.D.  Fax: [phone]

## 2010-09-05 NOTE — Consult Note (Signed)
Holly Rios, [name]                  ACCOUNT NO.:  [id number]   MEDICAL RECORD NO.:  [id number]          PATIENT TYPE:  INP   LOCATION:  4702                         FACILITY:  [facility]   PHYSICIAN:  Marlan Palau, M.D.  DATE OF BIRTH:  [birth year]   DATE OF CONSULTATION:  05/20/2004  DATE OF DISCHARGE:                                   CONSULTATION   HISTORY OF PRESENT ILLNESS:  Holly Rios is a 77 year old right handed white  female born 05/19/1925, with a history of multiple sclerosis.  The patient  apparently had some undulating symptoms associated with speech disturbance,  confused speech that occurred on May 19, 2004.  This seemed to improve  but was worse again on the day of admission.  The patient was brought in for  further evaluation.  CT scan of the head done on admission revealed a  possible subacute or acute stroke evolving in the left temporal parietal  area but extensive small vessel disease is also noted in the deep white  matter.  No hemorrhage was seen.  Neurology was asked to see this patient  for further evaluation.  Carotid Doppler studies have been done showing 40-  60% stenosis of the right internal carotid artery, no stenosis of the left  internal carotid artery, bilateral vertebral artery flow was seen in an  antegrade fashion.   PAST MEDICAL HISTORY:  Significant for:  1.  History of multiple sclerosis.  2.  New onset aphasia syndrome, left temporal parietal stroke by CT.  3.  Coronary artery bypass procedure in the past.  4.  Cataract surgery on the right.  5.  Depression in the past.  6.  Hiatal hernia.   MEDICATIONS ON ADMISSION:  Lumigan ophthalmic solution 1 drop OU q.h.s.,  Plavix 75 mg daily, Trusopt 1 drop OU q.h.s., Lexapro 10 mg daily, Zymar  ophthalmic solution 1 drop q.i.d. OD, Metoprolol 25 mg q.12h., multi-  vitamins one daily, prednisone ophthalmic solution 1 drop OD q.2h., Zocor 20  mg daily, Timoptic 1 drop OU q.h.s., Xanax 0.25 mg if  needed, Darvocet if  needed.   ALLERGIES:  Phenergan and Demerol.   HABITS:  The patient does not smoke, quit 30 years ago, he does not drink  alcohol.   SOCIAL HISTORY:  The patient is widowed, lives with daughter, is retired.   FAMILY HISTORY:  Mother died in her 50s with coronary artery disease and a  stroke, father died with coronary artery disease.   REVIEW OF SYMPTOMS:  This is difficult to obtain, the patient denies  significant headache, numbness or weakness, does not report any visual field  changes, nausea and vomiting.   PHYSICAL EXAMINATION:  VITAL SIGNS:  Blood pressure 126/74, heart rate 96, respiratory rate 16,  temperature afebrile.  GENERAL:  This patient is a fairly well developed elderly white female who  is alert and cooperative at the time of examination.  HEENT:  Head is atraumatic.  Eyes:  Pupils are post surgical on the right,  round, reactive bilaterally, cataract seen on the left, discs are  flat  bilaterally.  NECK:  Supple, no carotid bruits noted.  RESPIRATORY EXAM:  Clear.  CARDIOVASCULAR:  Regular rate and rhythm, no obvious murmurs or rubs noted.  EXTREMITIES:  Without significant edema.  NEUROLOGICAL:  Cranial nerves as above.  Facial asymmetry present.  The  patient has full extraocular movements.  Appears to have a right homogenous  visual field deficit.  The patient has imperfect understanding of speech,  has some problems with repetition although not severe, has some problems  with naming.  Pin prick sensation is questionably altered.  The patient  claims it is more prominent on the right than the left on the face.  The  patient perseverates significantly over several questions.  Examination does  not show any obvious motor weakness of the arms or legs.  No drift is seen.  The patient claims to have symmetry to pin prick and vibration on the arms  and legs.  The patient is quite ataxic, cannot follow commands well enough  to perform  finger-to-nose-to-finger and heel-to-shin accurately, was not  ambulated.  Deep tendon reflexes are relatively symmetric, somewhat brisk,  bilateral Babinski's were seen.   LABORATORY DATA:  Notable for white count 15.7, hemoglobin 13.1, hematocrit  38.9, platelets 267.  Sodium 140, potassium 4.1, chloride 110, CO2 26,  glucose 103, BUN 20, creatinine 0.9, calcium 10.2.   CT of the head is as above.   EKG reveals normal sinus rhythm with first degree AV block, left atrial  enlargement, anterior septal infarction age undetermined, heart rate of 82.   IMPRESSION:  1.  History of multiple sclerosis.  2.  New onset of left temporal parietal cerebrovascular infarction with      aphasia syndrome.   This patient appears to have extensive white matter changes either from MS  or pre-existing small vessel ischemic changes.  The patient has already had  a 2D echocardiogram, results are pending at this time.  Will proceed with  further workup.  Carotid Doppler studies are relatively unremarkable at this  time.   PLAN:  1.  MRI scan of the brain.  2.  MRI angiogram of the intracranial and extracranial vessels.  3.  The patient currently is on aspirin and Plavix, may consider switching      over to Plavix alone given the prior history of coronary artery disease.  4.  Speech therapy evaluation for aphasia.  5.  Will follow clinical course while in house.      CKW/MEDQ  D:  05/20/2004  T:  05/20/2004  Job:  [id number]   cc:   Melissa L. Ladona Ridgel, MD  Fax: [phone]

## 2010-09-05 NOTE — Discharge Summary (Signed)
NAMELY, WASS                              ACCOUNT NO.:  [id number]   MEDICAL RECORD NO.:  [id number]                   PATIENT TYPE:  INP   LOCATION:  2031                                 FACILITY:  [facility]   PHYSICIAN:  Salvatore Decent. Dorris Fetch, M.D.         DATE OF BIRTH:  05-Dec-1925   DATE OF ADMISSION:  12/04/2003  DATE OF DISCHARGE:  12/17/2003                                 DISCHARGE SUMMARY   ADMISSION DIAGNOSIS:  Unstable angina.   PAST MEDICAL HISTORY:  1. Multiple sclerosis, stable.  2. Hiatal hernia.   This patient is allergic to Demerol, Phenergan, aspirin, and indomethacin.   DISCHARGE DIAGNOSES:  1. Severe three vessel coronary artery disease with unstable angina, status     post coronary artery bypass graft.  2. Postoperative hemorrhage, status post re-exploration of mediastinum.   BRIEF HISTORY:  Ms. Koeneman is a 77 year old Caucasian female.  She began  having chest pain approximately two months prior to admission but in the six  weeks prior to admission, the pain became more frequent.  On the day of  admission, she had two episodes and presented to her primary care Jency Schnieders.  On EKG at that time was noted to be abnormal and she was sent to the  emergency department at [facility]  There, she was evaluated by  Dr. Donnalee Curry whose impression was that of probable unstable angina.  She asked for a cardiology consultation and she was evaluated by Dr.  Aleen Campi.  His recommendation was for admission to the hospital with  telemetry monitoring and to continue nitroglycerin, aspirin, Lovenox and  follow serial cardiac enzymes.   HOSPITAL COURSE:  On December 04, 2003, Ms. Bellizzi was admitted to [facility] under the care of Dr. Rito Ehrlich.  Her cardiac enzymes eventually  were returned positive for an AMI.  Dr. Aleen Campi recommended proceeding with  cardiac catheterization.  Ms. Szalkowski agreed with this and she underwent  cardiac catheterization on December 05, 2003.  Dr. Adelene Idler findings briefly  included critical three vessel coronary artery disease with normal left  ventricular function.  Ejection fraction estimated to be 70%.  Cardiac  surgery consultation was requested and she was seen later in the day by Dr.  Charlett Lango.  After examination of the patient and review of all the  available records, Dr. Dorris Fetch felt that while coronary artery bypass  grafting was a possibility for her coronary disease, due to her debilitated  state from her multiple sclerosis, he wanted to discuss the plan further  with Dr. Aleen Campi.  Dr. Aleen Campi felt that PTCA was not a good option given  her complex LAD disease.  Ms. Tellez continued to have intermittent chest pain  during her hospitalization.  Dr. Dorris Fetch had a long discussion with Ms.  Domanski and her family with them understanding that rehabilitation following  coronary bypass surgery would likely be difficult given her  preoperative  condition.  Ms. Colantonio understood this completely and wished to proceed with  surgery.   Preoperative arterial evaluation included carotid duplex study which  revealed no significant carotid artery disease.  Her lower extremity  evaluation included ABI's and these were found to be 0.76 on the right, 0.69  on the left.   Also preoperatively, physical therapy and occupational therapy services were  consulted.  They did work with her briefly prior to surgery but she was  unable to participate very much due to upper extremity pain.   On December 10, 2003, Ms. Sittner underwent the following surgical procedure with  Dr. Charlett Lango:  Coronary artery bypass grafting x3.  Grafts placed  at the time of procedure:  Left internal mammary artery graft to the left  anterior descending artery, saphenous venous graft to the first obtuse  marginal artery, saphenous venous graft to the distal right coronary artery.  The vein was harvested from bilateral thighs with the  end of vein harvesting  technique.  The vein from the right thigh was not suitable for bypass  grafting.  Ms. Orvis tolerated this procedure well, transferring in stable  condition to the SICU.  After an uneventful early postoperative course. Ms.  Chopp had a sudden massive output of bright red bleeding from her mediastinal  chest tubes.  She put out approximately 2 L in one hour.  Dr. Dorris Fetch  evaluated her immediately and felt re-exploration of her mediastinum was  indicated.  She was brought back to the operating room and underwent re-  exploration of her mediastinum and Dr. Dorris Fetch found active bleeding  from the vein graft.  This was repaired with suture.  Her chest was closed  and she was again returned to the SICU in stable but guarded condition.  She  was extubated several hours after arrival in the intensive care unit and  awoken from anesthesia neurologically intact.  She remained hemodynamically  stable in the immediate postoperative period.  She made sufficient progress  to be transferred to unit 2000 on postoperative day #3.   Ms. Trinka has been making slow but steady progress and recovering from her  surgeries.  She has been working both with cardiac rehabilitation service as  well as physical therapy and occupational therapy people.  Case management  has been following Ms. Lacock's progress throughout her hospitalization and as  expected, arrangements have been made for either a subacute care unit or  skilled nursing facility prior to discharge home.   On December 17, 2003 which is postoperative day #7, Ms. Epstein reports feeling  fairly well.  Her vital signs are stable with a blood pressure of 132/74.  She is afebrile.  Her room air saturation is 95%.  Her heart is maintaining  a normal sinus rhythm, 77 beats per minute.  Her lungs are clear to  auscultation.  She is tolerating her diet without nausea.  Her bowel and bladder functions are within normal limits for her.  Her  incisions are all  healing well.  She has no lower extremity edema.  She is ambulating with her  rolling walker this morning, ambulated 356 feet with some assistance and her  rolling walker.  Her gait is slightly shuffled.  She does drag her right  foot when she gets tired.  She did need to rest once during that distance.  Overall, Ms. Slezak is making very good progress after her surgeries and she  is ready for transfer to day to  a skilled nursing facility.  There is a bed  available at [facility] and she will be transferred  there later today.   LABORATORY STUDIES:  On December 14, 2003, CBC:  White blood cells 12.2,  hemoglobin 8.9, hematocrit 25.8, platelets 173,000.  Chemistries on December 15, 2003:  Sodium 141, potassium 4.6, chloride 110, CO2 22, BUN 19,  creatinine 0.8, glucose 124.   CONDITION ON DISCHARGE:  Improved.   INSTRUCTIONS ON DISCHARGE:  Medications:  She is to continue her current  medication regimen that includes:  1. Lumigan eye drops 0.03% one drop both eyes q.h.s.  2. Trusopt eye drops 2% solution both eyes one drop b.i.d.  3. Timoptic 0.5% solution one drop both eyes b.i.d.  4. Lopressor 25 mg p.o. b.i.d.  5. Lasix 40 mg p.o. daily.  6. Potassium chloride 20 mEq p.o. daily.  7. Xanax 0.25 mg p.o. b.i.d. p.r.n.  8. For pain relief, she may have Darvocet-N 100 1-2 q.4h. p.r.n. for     moderate to severe pain or Tylenol 325 mg 1-2 p.o. q.4h. for mild pain.   ACTIVITY:  She is to continue increasing her activity as tolerated.  It is  expected she will continue to participate with physical therapy and  occupational therapy service as well at the extended care center.   DIET:  She will continue to be a heart healthy diet.   WOUND CARE:  Her incisions are to be cleaned daily with mild soap and water.  She may shower.  Particular attention should be paid to the groin area.  She  has two small incisions in each groin.  These are healing well.  The  area  needs to be kept clean and dry.   FOLLOW UP:  Dr. Aleen Campi should see her back in his office in approximately  two weeks.  She has been asked to call to arrange that appointment.  Dr.  Dorris Fetch would like to see her back at the [facility] office on Monday,  January 07, 2004 at 1 p.m. in the afternoon.  She will be asked to have a  chest x-ray, GDC, one hour prior to that appointment.  She will be given the  film to bring to Dr. Sunday Corn office for him to review.      Toribio Harbour, N.P.                  Salvatore Decent Dorris Fetch, M.D.    CTK/MEDQ  D:  12/17/2003  T:  12/17/2003  Job:  [id number]   cc:   Salvatore Decent. Dorris Fetch, M.D.  [address]  [place]  [place] [address]   Aram Candela. Aleen Campi, M.D.  [address]  [place]  [place] [address]  Fax: [phone]   L. Lupe Carney, M.D.  [address] [place]  [place] [address]  Fax: [phone]

## 2010-09-05 NOTE — H&P (Signed)
NAMEMAILEN, NEWBORN                  ACCOUNT NO.:  [id number]   MEDICAL RECORD NO.:  [id number]          PATIENT TYPE:  INP   LOCATION:  4702                         FACILITY:  [facility]   PHYSICIAN:  Melissa L. Ladona Ridgel, MD  DATE OF BIRTH:  [birth year]   DATE OF ADMISSION:  05/19/2004  DATE OF DISCHARGE:                                HISTORY & PHYSICAL   PRIMARY CARE PHYSICIAN:  Dr. Elsworth Soho.   CHIEF COMPLAINT:  I can't talk and confusion as per the patient's  daughter.   HISTORY OF PRESENT ILLNESS:  The patient is a 76 year old white female whose  family noticed last p.m. that she was a little bit confused; they found her  in the laundry room trying to turn on the television.  She was easily  redirectable and showed no other neurological deficits at that time.  Her  daughter put her to bed and this morning her sitter came to take the  daughter's place.  The patient was speaking okay at 10 a.m. when the  daughter called, but later in the morning, she was noted to be confused and  had altered speech.   REVIEW OF SYSTEMS:  She has no fever, chills, nausea, vomiting, weight loss,  chest pain, shortness of breath, dyspnea on exertion, complaints of  headaches or visual change.  All other review of systems appear to be  negative according to the patient's daughter.  The patient is unable to give  a significant amount of history because of aphasia.   PAST MEDICAL HISTORY:  She has multiple sclerosis, which has been stable for  some time.  Tuesday of last week she underwent cataract surgery.  She has  had bypass in August of '05.  She has a history of depression and a history  of hiatal hernia.   PAST SURGICAL HISTORY:  As stated, cataract surgery last Tuesday and a CABG  in '05.   SOCIAL HISTORY:  She does not smoke; she quit many, many years ago.  She  does not drink.   FAMILY HISTORY:  Her mom and dad are both deceased secondary to coronary  artery disease and mom had a  stroke.  She is married with children.   ALLERGIES:  Allergies are to [facility] and DEMEROL.  The electronic medical  record also indicates INDOMETHACIN and ASPIRIN are allergies for her, but  her family denies that and the patient is currently taking aspirin.   MEDICATIONS:  1.  Lexapro 10 mg q.a.m.  2.  Lopressor 25 mg b.i.d.  3.  Baby aspirin daily.  4.  Xanax 0.25 mg nightly.  5.  Lipitor 10 mg nightly.  6.  Darvocet-N 100 one plus a half a tab nightly.  7.  Requip 1 tablet nightly.  8.  Iron sulfate 325 mg b.i.d.  9.  A multivitamin q.a.m.  10. Calcium q.a.m.  11. Cosopt 1 drop to both eyes nightly.  12. Lumigan 0.03% nightly to both eyes, 1 drop.  13. Prednisolone 1% one drop q.2 h. to the right eye.  14. Vigamox 0.5% to the  right eye 4 times daily.   PHYSICAL EXAMINATION:  VITAL SIGNS:  Temperature is 96.8.  Blood pressure is  148/72; during the ED visit, it was as high as 179/82; during my exam, it  was 150/70.  Pulse is 79.  Respiratory rate is 22.  Saturation is 99%.  GENERAL:  Generally, she is in no acute distress.  She is unable to make  full sentences, although she can answer some yes-and-no questions and is a  little bit slow with following commands.  HEENT:  She is normocephalic, atraumatic.  Pupils are equal, round and  reactive to light.  Extraocular movements are intact.  Fundi cannot be  assessed due to her cataract surgery.  Mucous membranes are moist.  NECK:  Her neck is supple.  There is no JVD and no lymph nodes.  CHEST:  Her chest is clear to auscultation.  There are no rales, rhonchi or  wheezes.  She has a well-healed CABG scar on her chest.  CARDIOVASCULAR:  Regular rate and rhythm, positive S1 and S2, no  S3 or S4.  I cannot appreciate any murmurs, rubs or gallops.  ABDOMEN:  Abdomen is soft, nontender and non-distended with positive bowel  sounds.  EXTREMITIES:  Extremities show no clubbing, cyanosis or edema.  She has 2+  pulses.  NEUROLOGIC:   Neurologically, she cannot formulate a thought into words.  She  can occasionally answer yes or no and is able to have some communication  with her daughter.  Power is 4/5 in the right upper and right lower  extremities, which is decreased compared to the left side, which is 5/5.  Her responses to requests on the right side also appear to be much slower  than the left side.  DTRs on the right upper extremity are 2++ compared to  the left upper extremity, which is less hyperreflexic.   LABORATORY DATA:  Laboratories reveal a white count of 12.7, hemoglobin of  12.5, hematocrit of 37.1, platelets of 250,000.  Her sodium is 137,  potassium is 4.5, chloride is 109, CO2 is 23, BUN is 11, creatinine is 1.0,  glucose is 122.  Her LFTs are within normal limits.  Her alkaline  phosphatase is 130.   CT shows severe atrophy with chronic small vessel disease.  There is an  acute/subacute left temporal to left temporoparietal lesion that did not  show any hemorrhage consistent with stroke.   EKG shows 82 beats per minute, normal sinus rhythm, no ST-T wave changes and  left atrial enlargement, which is a new finding compared to her last echo.   ASSESSMENT AND PLAN:  This is a 77 year old white female with a past medical  history for multiple sclerosis, which is in remission, coronary artery  bypass graft in August of 2005, who presented with greater than 10 hours of  stuttering confusion, culminating in aphasia which is noticeable to her  family.   1.  Neurological:  The CT is positive for acute versus subacute left      temporal versus temporoparietal stroke.  We will start her on Plavix.  I      have consulted neurology, who will see her in a timely fashion.  2.  Pulmonary:  There are no clinical significant findings for pulmonary      disease at this time and we will monitor her O2 saturations and check an     x-ray for possible aspiration.  3.  Gastrointestinal:  We will obtain a speech  evaluation in the morning to      address her language issues as well as swallowing evaluation.  4.  Genitourinary:  We will check a UA C&S; with a mild increased WBC, we      need to rule out possible infection;      however, demargination secondary to the stress of stroke can occur.  5.  Cardiovascular:  Stable.  We will check her cholesterol and keep her      blood pressures between 140 and 150.  6.  Physical therapy and occupational therapy will be consulted to assess      her gait and ability to follow commands.      MLT/MEDQ  D:  05/20/2004  T:  05/20/2004  Job:  [id number]   cc:   L. Lupe Carney, M.D.  [address] [place]  [place] [address]  Fax: [phone]

## 2010-09-05 NOTE — Cardiovascular Report (Signed)
Holly Rios, Holly Rios                              ACCOUNT NO.:  [id number]   MEDICAL RECORD NO.:  [id number]                   PATIENT TYPE:  INP   LOCATION:  6526                                 FACILITY:  [facility]   PHYSICIAN:  John R. Tysinger, M.D.              DATE OF BIRTH:  02/13/1926   DATE OF PROCEDURE:  12/05/2003  DATE OF DISCHARGE:                              CARDIAC CATHETERIZATION   REFERRED BY:  Dr. Clovis Riley.   PROCEDURES:  1. Left heart catheterization.  2. Coronary cineangiography.  3. Right and left internal mammary artery cineangiography.  4. Left ventricular cineangiography.  5. Abdominal aortogram.  6. Angio-Seal of the right femoral artery.   INDICATION FOR PROCEDURES:  This 77 year old female was admitted on December 04, 2003 with unstable angina.  Her electrocardiogram showed ST depression  with faster heart rates and her fourth set of cardiac enzymes were abnormal  for cardiac ischemia.  She was then scheduled for cardiac catheterization.   PROCEDURE:  After signing an informed consent, the patient was taken to the  cardiac catheterization lab where her right groin was prepped and draped in  sterile fashion and anesthetized locally with 1% lidocaine.  A 6 French  introducer sheath was inserted percutaneously into the right femoral artery.  6 [place] #4 Judkins coronary catheters were used to make injections into the  native coronary arteries.  The right coronary catheter was used to make  injections midstream into both right and left subclavian arteries  visualizing the right and left internal mammary arteries.  A 6 French  pigtail catheter was used to measure pressures in the left ventricle and  aorta and to make midstream injections into the left ventricle and abdominal  aorta.  The patient tolerated the procedure well and no complications were  noted.  At the end of the procedure, the catheter and sheath were removed  from the right femoral artery and  hemostasis was easily obtained with an  Angio-Seal closure system.   MEDICATIONS GIVEN:  None.   CINE FINDINGS:   CORONARY CINEANGIOGRAPHY:  1. Left coronary artery:  The ostium and left main appear normal.  2. Left anterior descending:  The LAD has a focal eccentric lesion in its     proximal segment causing a 95% stenosis.  There is a segmental plaque     throughout the proximal segment causing a segmental narrowing of 50-60%.     In the middle segment, there is a second segmental plaque causing     segmental narrowing of 40-50%.  The distal LAD appears normal.  3. Circumflex coronary artery:  The circumflex is a large obtuse marginal     branch with a severe 90% stenosis in the proximal segment.  There is a     90% stenosis also in the middle segment.  The distal circumflex supplying     the obtuse marginal and  posterior lateral circulation appears normal.  4. Right coronary artery:  The ostium appears normal.  There is a segmental     plaque in the proximal segment causing a 30-40% segmental narrowing.     There is a focal concentric 99% stenosis in the middle segment just     before the acute angle.  The distal segment including the posterior     descending and posterior lateral branches appear normal.  5. Right internal mammary artery appears normal.  Left internal mammary     artery appears normal.   LEFT VENTRICULAR CINEANGIOGRAM:  The left ventricular chamber size and  contractility appear normal.  The overall left ventricular contractility is  normal with an ejection fraction of approximately 70%.  There are no  abnormally moving segments.  The mitral and aortic valves appear normal.   ABDOMINAL AORTOGRAM:  The suprarenal abdominal aorta is normal in  appearance.  The right renal artery is normal.  The left renal artery has a  mild irregularity with normal flow.  The infrarenal abdominal aorta is  abnormal with a mild-to-moderate aneurysmal dilatation which is focal and  in  the middle segment of the infrarenal abdominal aorta.  The bifurcation  appears normal.  The common iliac arteries are mildly irregular with plaque  throughout, but without stenosis or aneurysm.   RIGHT FEMORAL ANGIOGRAPHY:  A hand injection into the right femoral artery  showed severe stenosis in the right superficial femoral artery with poor  antegrade flow and the majority of flow distally was in the right profundus.  There was evidence for probable total occlusion distally in the right  superficial femoral artery.   FINAL DIAGNOSES:  1. Critical three-vessel coronary artery disease, 95% proximal left anterior     descending stenosis, 90% proximal and mid circumflex stenosis, 99% mid     right coronary artery stenosis.  2. Normal right and left internal mammary arteries.  3. Normal left ventricular function.  Ejection fraction approximately 70%.  4. Normal mitral and aortic valves.  5. Mild-to-moderate abdominal aortic aneurysm infrarenal, focal.  6. Evidence for severe stenosis distal right superficial femoral artery.  7. Successful Angio-Seal of the right femoral artery.   DISPOSITION:  Will ask CVTS to see for coronary artery bypass graft surgery.  We will follow her abdominal aortic aneurysm and peripheral vascular disease  from my office.                                               John R. Tysinger, M.D.    JRT/MEDQ  D:  12/05/2003  T:  12/06/2003  Job:  [id number]   cc:   L. Lupe Carney, M.D.  [address] [place]  [place] [address]  Fax: [phone]

## 2010-12-31 IMAGING — CR DG CHEST 1V PORT
1 series · 1 of 1 positions shown · non-contrast
Comparison: 09/16/2005

CLINICAL DATA: Chest pain

PORTABLE CHEST - 1 VIEW

[AP]
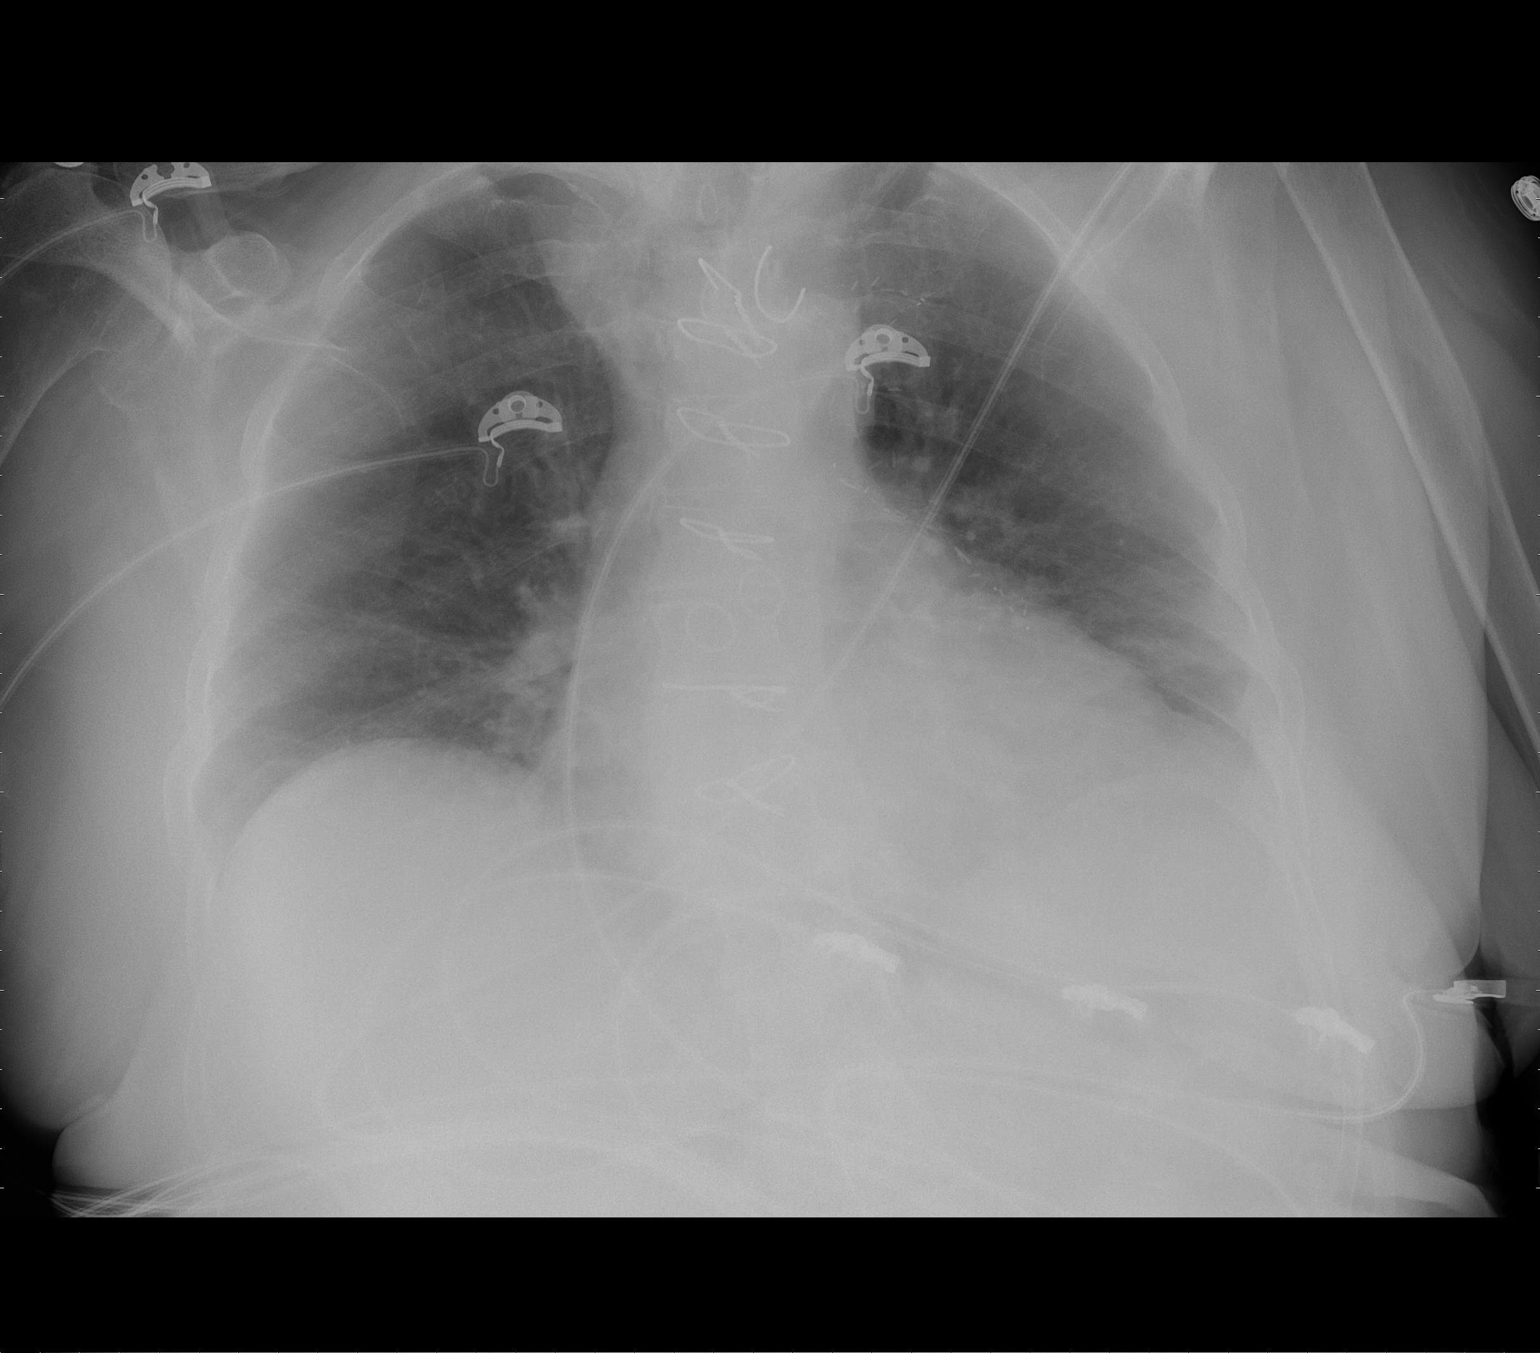

[1 of 1 positions shown; findings below may reference images not displayed]

FINDINGS: Borderline cardiomegaly.   Pulmonary vascular congestion.
No florid pulmonary edema.  Linear scarring versus subsegmental
atelectasis left lower lung zone.
IMPRESSION: Pulmonary vascular congestion.  Left lateral base scarring versus
subsegmental atelectasis.

## 2013-08-20 ENCOUNTER — Emergency Department (HOSPITAL_COMMUNITY)
Admission: EM | Admit: 2013-08-20 | Discharge: 2013-09-18 | Disposition: E | Payer: Medicare HMO | Attending: Emergency Medicine | Admitting: Emergency Medicine

## 2013-08-20 DIAGNOSIS — I469 Cardiac arrest, cause unspecified: Secondary | ICD-10-CM | POA: Insufficient documentation

## 2013-08-20 DIAGNOSIS — G35 Multiple sclerosis: Secondary | ICD-10-CM | POA: Insufficient documentation

## 2013-08-20 DIAGNOSIS — F0390 Unspecified dementia without behavioral disturbance: Secondary | ICD-10-CM | POA: Insufficient documentation

## 2013-08-20 LAB — CBG MONITORING, ED: GLUCOSE-CAPILLARY: 347 mg/dL — AB (ref 70–99)

## 2013-08-20 MED ORDER — EPINEPHRINE HCL 0.1 MG/ML IJ SOSY
PREFILLED_SYRINGE | INTRAMUSCULAR | Status: AC | PRN
  Administered 2013-08-20 (×2): 1 mg via INTRAVENOUS

## 2013-08-20 MED ORDER — SODIUM BICARBONATE 8.4 % IV SOLN
INTRAVENOUS | Status: AC | PRN
  Administered 2013-08-20: 50 meq via INTRAVENOUS

## 2013-08-21 MED FILL — Medication: Qty: 1 | Status: AC

## 2013-09-18 NOTE — ED Notes (Signed)
PER Family -Pt made a DNR and care withdrawn.

## 2013-09-18 NOTE — Progress Notes (Signed)
Patient came in intubated with a king airway' MD replaced with a 7.5 ETT taped at lips SATS remained in 90's during ETT change good color change on end tidal CO2 detector, BBS equal coarse, placed on above vent settings.

## 2013-09-18 NOTE — ED Notes (Signed)
MD at bedside.EDP WARD

## 2013-09-18 NOTE — Code Documentation (Signed)
Patient time of death occurred at . [phone]

## 2013-09-18 NOTE — ED Notes (Signed)
Family called 911 after finding Pt unresponsive on the floor. Pt was in asytole on arrival to PT home . CPR  started atotal of 8 doses of EPI were given via IO site in LLE. Pulse returned and PT trans ported to ED.PT arried to ED with CPR in progress . Air way support provided by [employer] air way. And bag vent.

## 2013-09-18 NOTE — Progress Notes (Signed)
Chaplain responded to post-CPR page for pt in Trauma B. I brought large family from ED waiting to consult B. Dr. Elesa Massed advised them that pt still had pulse but could pass anytime. Family requested prayer before going to see pt. After prayer, Dr. Elesa Massed returned to say that pt had just expired. Family members went two at a time to see pt in Trauma B. At nurse's request I took family to Consult C, since pt was to be moved to C26. I continued to provide support to family, particularly to pt's son.

## 2013-09-18 NOTE — Code Documentation (Signed)
Family updated as to patient's status.INformation  Of POA and funeral home.

## 2013-09-18 NOTE — ED Notes (Signed)
Pt family has completed viewing. Magda Paganini made aware

## 2013-09-18 NOTE — Code Documentation (Signed)
EDP Ward at bed side . Time of death 1256/08/10

## 2013-09-18 NOTE — ED Provider Notes (Signed)
TIME SEEN: 12:30 PM  CHIEF COMPLAINT: Cardiac arrest  HPI: Patient is a 80 y.o. F with history of multiple sclerosis, dementia who presents emergency department status post cardiac arrest. Per EMS, patient was found down by her family who came to check on her today. It appears patient had been eating and may have aspirated. She was found pulseless in asystole. Patient had approximately one hour of CPR, 8 rounds of 1 mg IO epinephrine, D50, Narcan by EMS. A King airway was placed.  They had return of spontaneous circulation approximately 15-20 minutes prior to arrival in the emergency department. Upon arrival to the emergency department, patient lost her pulse again and CPR was started. She was in PEA.  In the emergency department, patient was given another 2 rounds of IO epinephrine, 1 amp of bicarb and had return of spontaneous circulation approximately 10 minutes later.  ROS: Unobtainable due to patient's condition  PAST MEDICAL HISTORY/PAST SURGICAL HISTORY:  No past medical history on file.  MEDICATIONS:  Prior to Admission medications   Not on File    ALLERGIES:  Allergies not on file  SOCIAL HISTORY:  History  Substance Use Topics  . Smoking status: Not on file  . Smokeless tobacco: Not on file  . Alcohol Use: Not on file    FAMILY HISTORY: No family history on file.  EXAM: Wt 85 lb (38.556 kg) CONSTITUTIONAL: Patient unresponsive, GCS 3 HEAD: Normocephalic EYES: Conjunctivae clear, pupils are fixed and dilated ENT: normal nose; no rhinorrhea; moist mucous membranes; King airway in place NECK: Supple, no meningismus, no LAD  CARD: Patient is pulseless, no spontaneous cardiac activity RESP: King airway in place, no spontaneous respirations, progress breath sounds bilaterally ABD/GI: Normal bowel sounds; non-distended; soft BACK:  The back appears normal and there are 2 stage II sacral decubitus ulcers without surrounding erythema or purulent drainage EXT: Patient has  bilateral edema in her feet, worse in her left foot SKIN: Skin is mottled, cool NEURO: GCS 3, no spontaneous movement   MEDICAL DECISION MAKING: Patient here status post cardiac arrest. Patient had return of spontaneous circulation after approximately 10 minutes in the emergency department. Replaced King airway with endotracheal tube. Discussed with patient's family report that she would not want life support including a ventilator or any further measures to keep her alive and they would not want further CPR, defibrillation or further resuscitation if she lost her pulse again. Plan is for family to view the patient and make her comfortable and withdraw life support.  ED PROGRESS: 12:58 PM  Pt reassessed and has no pulse, in PEA. Bedside ultrasound shows very minimal cardiac activity. Time of death called at 2:50 PM. Family notified.   1:45 PM  Spoke with Dr. Jillyn Hidden who is on call for Dr. Clovis Riley with [facility] who is patient's primary care physician. They will complete the death certificate for patient.   CRITICAL CARE Performed by: Layla Maw Jameah Rouser   Total critical care time: 45 minutes  Critical care time was exclusive of separately billable procedures and treating other patients.  Critical care was necessary to treat or prevent imminent or life-threatening deterioration.  Critical care was time spent personally by me on the following activities: development of treatment plan with patient and/or surrogate as well as nursing, discussions with consultants, evaluation of patient's response to treatment, examination of patient, obtaining history from patient or surrogate, ordering and performing treatments and interventions, ordering and review of laboratory studies, ordering and review of radiographic studies, pulse  oximetry and re-evaluation of patient's condition.   Cardiopulmonary Resuscitation (CPR) Procedure Note Directed/Performed by: Layla MawKristen N Rumi Taras I personally directed  ancillary staff and/or performed CPR in an effort to regain return of spontaneous circulation and to maintain cardiac, neuro and systemic perfusion.    INTUBATION Performed by: Layla MawKristen N Terilynn Buresh  Required items: required blood products, implants, devices, and special equipment available Patient identity confirmed: provided demographic data and hospital-assigned identification number Time out: Immediately prior to procedure a "time out" was called to verify the correct patient, procedure, equipment, support staff and site/side marked as required.  Indications: Cardiac arrest or respiratory failure   Intubation method: 3.0  MAC  Preoxygenation: King airway  Sedatives: None  Paralytic: None   Tube Size: 7.5 cuffed  Post-procedure assessment: chest rise and ETCO2 monitor Breath sounds: equal and absent over the epigastrium Tube secured with: ETT holder   Patient tolerated the procedure well with no immediate complications.       EKG Interpretation  Date/Time:  Sunday Aug 20 2013 12:41:26 EDT Ventricular Rate:  104 PR Interval:  148 QRS Duration: 118 QT Interval:  337 QTC Calculation: 443 R Axis:   69 Text Interpretation:  Fast sinus arrhythmia Incomplete right bundle branch block Baseline wander in lead(s) V1 V5 V6 Confirmed by Capria Cartaya,  DO, Jeson Camacho ([id number]) on 08/30/2013 1:38:50 PM        Layla MawKristen N Allah Reason, DO 08-31-2013 [id number]

## 2013-09-18 DEATH — deceased
